# Patient Record
Sex: Male | Born: 1995 | Race: White | Hispanic: No | Marital: Single
Health system: Southern US, Community
[De-identification: ages and names within clinical notes are randomized; demographics above are authoritative.]

---

## 2014-05-28 ENCOUNTER — Emergency Department (HOSPITAL_COMMUNITY): Payer: Self-pay

## 2014-05-28 ENCOUNTER — Emergency Department (HOSPITAL_COMMUNITY)
Admission: EM | Admit: 2014-05-28 | Discharge: 2014-05-28 | Disposition: A | Discharge status: Home / Self Care | Payer: Self-pay | Attending: Emergency Medicine | Admitting: Emergency Medicine

## 2014-05-28 ENCOUNTER — Encounter (HOSPITAL_COMMUNITY): Payer: Self-pay | Admitting: Emergency Medicine

## 2014-05-28 DIAGNOSIS — S71009A Unspecified open wound, unspecified hip, initial encounter: Secondary | ICD-10-CM | POA: Insufficient documentation

## 2014-05-28 DIAGNOSIS — S71109A Unspecified open wound, unspecified thigh, initial encounter: Principal | ICD-10-CM | POA: Insufficient documentation

## 2014-05-28 DIAGNOSIS — S71131A Puncture wound without foreign body, right thigh, initial encounter: Secondary | ICD-10-CM

## 2014-05-28 DIAGNOSIS — Y929 Unspecified place or not applicable: Secondary | ICD-10-CM | POA: Insufficient documentation

## 2014-05-28 DIAGNOSIS — Y939 Activity, unspecified: Secondary | ICD-10-CM | POA: Insufficient documentation

## 2014-05-28 DIAGNOSIS — W3400XA Accidental discharge from unspecified firearms or gun, initial encounter: Secondary | ICD-10-CM | POA: Insufficient documentation

## 2014-05-28 LAB — CBC WITH DIFFERENTIAL/PLATELET
Basophils Absolute: 0.1 10*3/uL (ref 0.0–0.1)
Basophils Relative: 1 % (ref 0–1)
EOS ABS: 0.1 10*3/uL (ref 0.0–0.7)
Eosinophils Relative: 1 % (ref 0–5)
HEMATOCRIT: 39.6 % (ref 39.0–52.0)
Hemoglobin: 13.3 g/dL (ref 13.0–17.0)
LYMPHS PCT: 31 % (ref 12–46)
Lymphs Abs: 4.2 10*3/uL — ABNORMAL HIGH (ref 0.7–4.0)
MCH: 24.9 pg — ABNORMAL LOW (ref 26.0–34.0)
MCHC: 33.6 g/dL (ref 30.0–36.0)
MCV: 74 fL — ABNORMAL LOW (ref 78.0–100.0)
Monocytes Absolute: 0.7 10*3/uL (ref 0.1–1.0)
Monocytes Relative: 5 % (ref 3–12)
NEUTROS ABS: 8.6 10*3/uL — AB (ref 1.7–7.7)
NEUTROS PCT: 62 % (ref 43–77)
PLATELETS: 314 10*3/uL (ref 150–400)
RBC: 5.35 MIL/uL (ref 4.22–5.81)
RDW: 13.5 % (ref 11.5–15.5)
WBC: 13.7 10*3/uL — AB (ref 4.0–10.5)

## 2014-05-28 LAB — I-STAT CHEM 8, ED
BUN: 10 mg/dL (ref 6–23)
Calcium, Ion: 1.15 mmol/L (ref 1.12–1.23)
Chloride: 102 mEq/L (ref 96–112)
Creatinine, Ser: 1 mg/dL (ref 0.50–1.35)
GLUCOSE: 101 mg/dL — AB (ref 70–99)
HCT: 47 % (ref 39.0–52.0)
HEMOGLOBIN: 16 g/dL (ref 13.0–17.0)
POTASSIUM: 3.2 meq/L — AB (ref 3.7–5.3)
Sodium: 142 mEq/L (ref 137–147)
TCO2: 24 mmol/L (ref 0–100)

## 2014-05-28 LAB — TYPE AND SCREEN
ABO/RH(D): B POS
Antibody Screen: NEGATIVE

## 2014-05-28 LAB — ABO/RH: ABO/RH(D): B POS

## 2014-05-28 LAB — PROTIME-INR
INR: 1.11 (ref 0.00–1.49)
Prothrombin Time: 14.1 seconds (ref 11.6–15.2)

## 2014-05-28 LAB — I-STAT CG4 LACTIC ACID, ED: LACTIC ACID, VENOUS: 2.72 mmol/L — AB (ref 0.5–2.2)

## 2014-05-28 MED ORDER — OXYCODONE-ACETAMINOPHEN 5-325 MG PO TABS: 1.0000 | ORAL_TABLET | Freq: Four times a day (QID) | ORAL | Status: AC | PRN

## 2014-05-28 MED ORDER — CEFAZOLIN SODIUM 1-5 GM-% IV SOLN
1.0000 g | Freq: Once | INTRAVENOUS | Status: AC
  Administered 2014-05-28: 1 g via INTRAVENOUS
  Filled 2014-05-28: qty 50

## 2014-05-28 MED ORDER — OXYCODONE-ACETAMINOPHEN 5-325 MG PO TABS
2.0000 | ORAL_TABLET | Freq: Once | ORAL | Status: AC
  Administered 2014-05-28: 2 via ORAL
  Filled 2014-05-28: qty 2

## 2014-05-28 MED ORDER — TETANUS-DIPHTH-ACELL PERTUSSIS 5-2.5-18.5 LF-MCG/0.5 IM SUSP
0.5000 mL | Freq: Once | INTRAMUSCULAR | Status: AC
  Administered 2014-05-28: 0.5 mL via INTRAMUSCULAR
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
## 2014-05-28 NOTE — ED Provider Notes (Signed)
Medical screening examination/treatment/procedure(s) were conducted as a shared visit with non-physician practitioner(s) and myself.  I personally evaluated the patient during the encounter.  PLEASE SEE OTHER NOTE FOR BRIEF SUMMARY.   Brandt LoosenJulie Manly, MD 05/28/14 [phone]
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
## 2014-05-28 NOTE — ED Notes (Signed)
Pt demonstrated satisfactory use of crutches, friends and patient verbalize understanding of d/c instructions.

## 2014-05-28 NOTE — ED Notes (Signed)
Pressure dressing applied to R thigh by RN and PA with sterile gauze covering wounds.
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
